# Patient Record
Sex: Female | Born: 2000 | Race: Black or African American | Hispanic: No | Marital: Single | Smoking: Never smoker
Health system: Southern US, Community
[De-identification: ages and names within clinical notes are randomized; demographics above are authoritative.]

## PROBLEM LIST (undated history)

## (undated) DIAGNOSIS — J189 Pneumonia, unspecified organism: Secondary | ICD-10-CM

## (undated) HISTORY — PX: TONSILLECTOMY: SUR1361

## (undated) HISTORY — PX: ADENOIDECTOMY: SUR15

---
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
## 2019-10-14 IMAGING — CR DG CHEST 2V
2 series · 2 of 2 positions shown · non-contrast
Comparison: None.

CLINICAL DATA: Cough x4 days

EXAM:
CHEST - 2 VIEW

[chest pa]
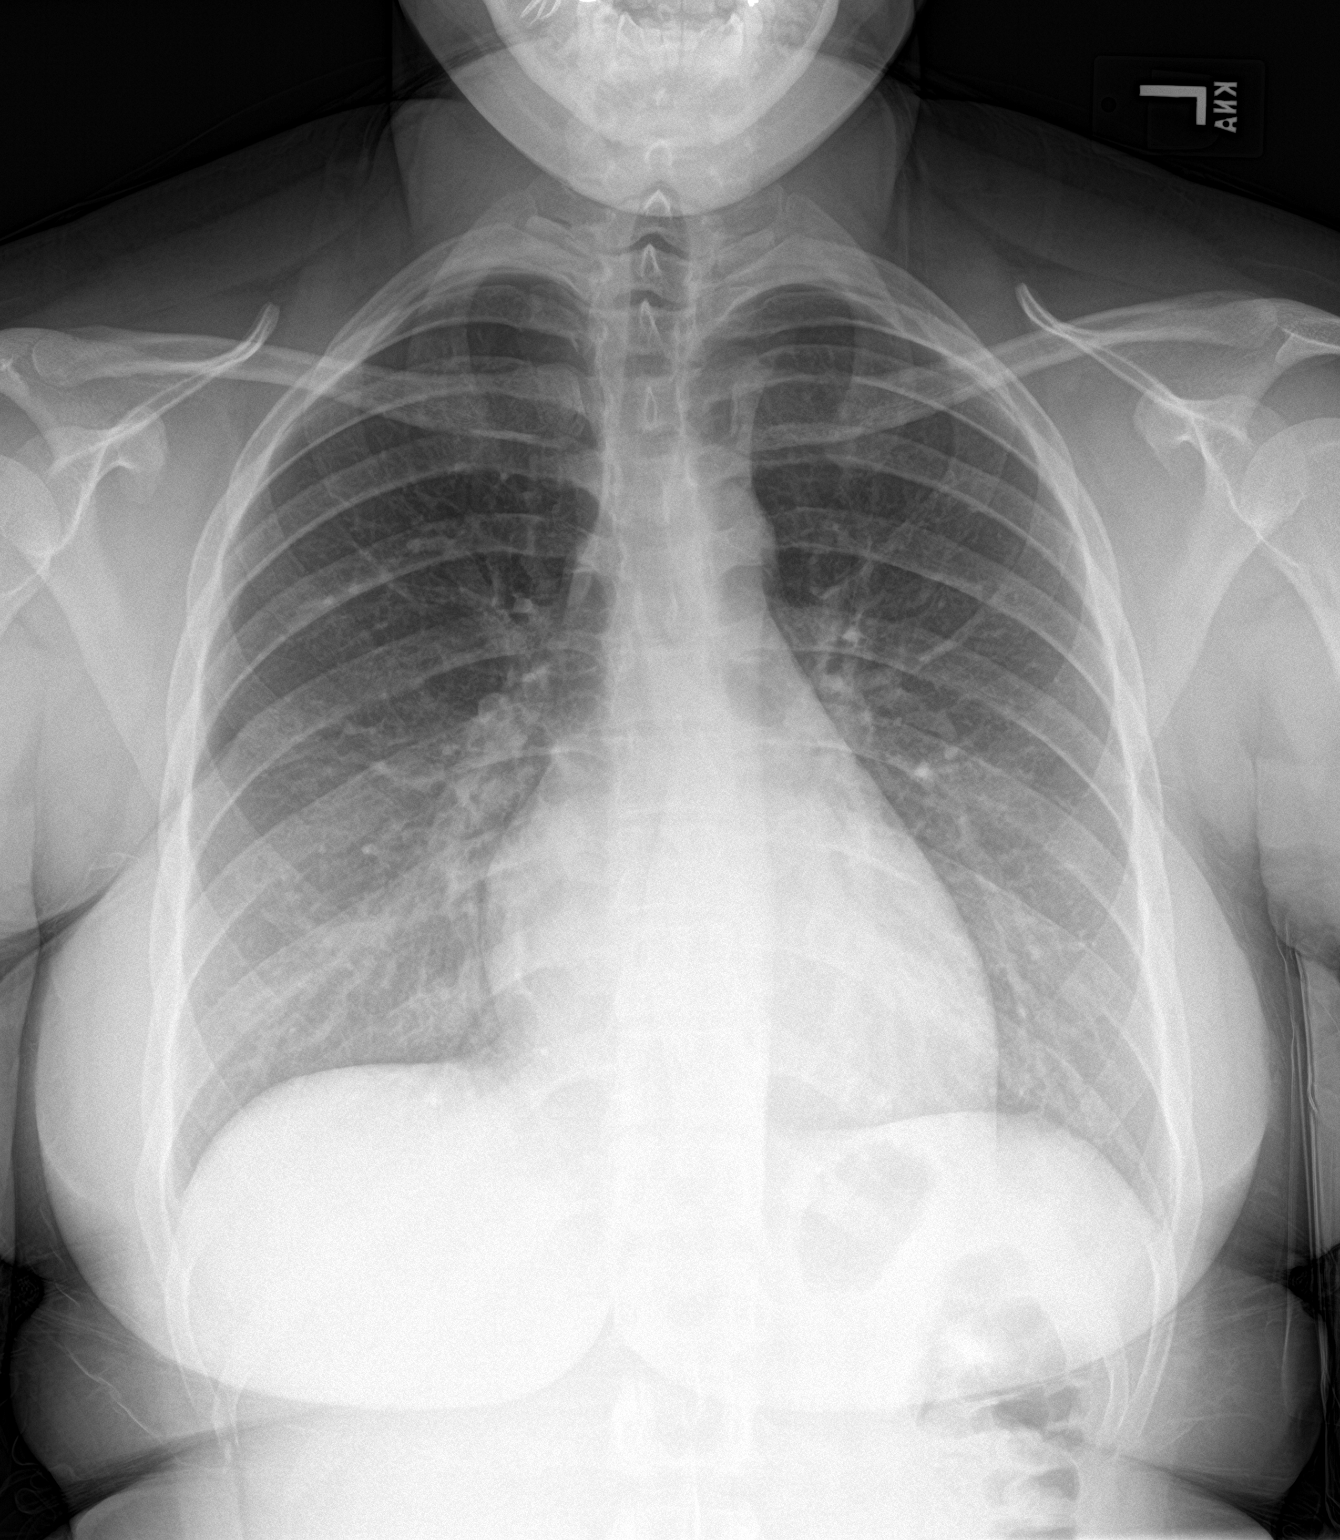

[chest lat]
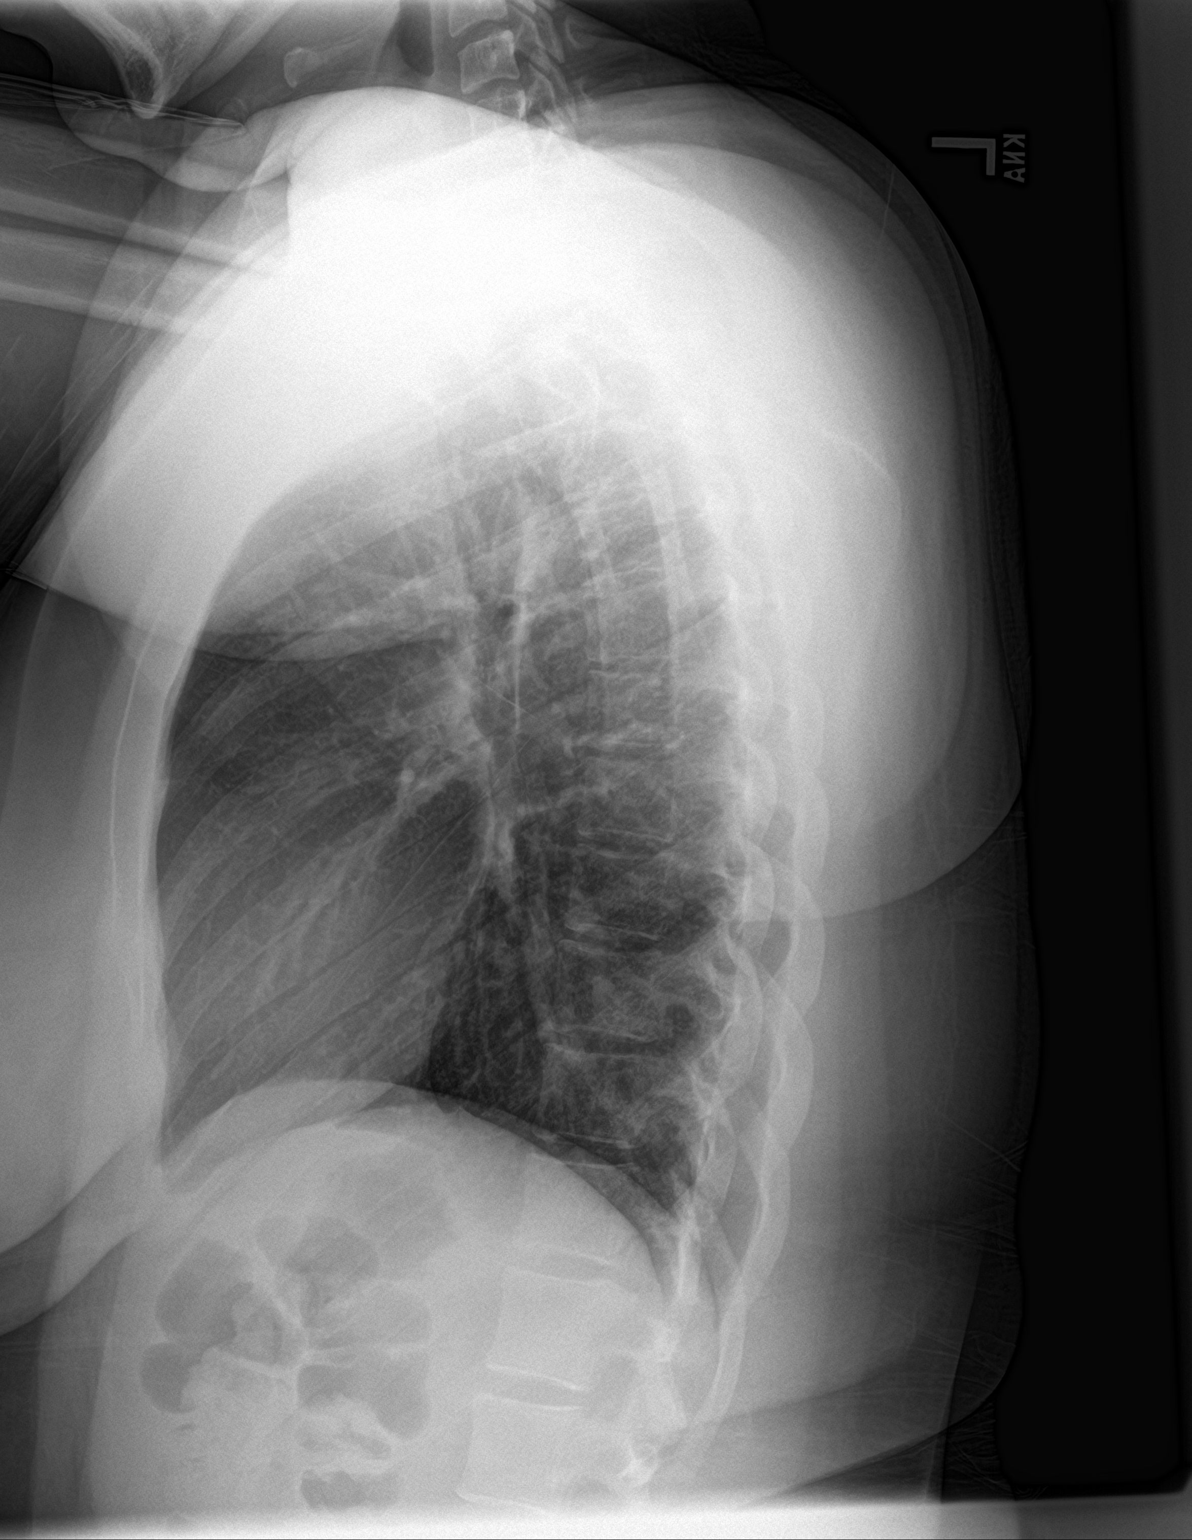

[2 of 2 positions shown; findings below may reference images not displayed]

FINDINGS: The heart size and mediastinal contours are within normal limits.
Both lungs are clear. The visualized skeletal structures are
unremarkable.
IMPRESSION: No active cardiopulmonary disease.
